# Patient Record
Sex: Female | Born: 1990
Health system: Southern US, Community
[De-identification: ages and names within clinical notes are randomized; demographics above are authoritative.]

## PROBLEM LIST (undated history)

## (undated) DIAGNOSIS — J45909 Unspecified asthma, uncomplicated: Secondary | ICD-10-CM

## (undated) HISTORY — PX: BREAST SURGERY: SHX581

---

## 2019-04-05 ENCOUNTER — Ambulatory Visit
Admission: EM | Admit: 2019-04-05 | Discharge: 2019-04-05 | Disposition: A | Discharge status: Home / Self Care | Payer: 59 | Attending: Family Medicine | Admitting: Family Medicine

## 2019-04-05 ENCOUNTER — Other Ambulatory Visit: Payer: Self-pay

## 2019-04-05 ENCOUNTER — Ambulatory Visit (INDEPENDENT_AMBULATORY_CARE_PROVIDER_SITE_OTHER): Payer: 59

## 2019-04-05 DIAGNOSIS — M79671 Pain in right foot: Secondary | ICD-10-CM

## 2019-04-05 HISTORY — DX: Unspecified asthma, uncomplicated: J45.909

## 2019-04-05 NOTE — ED Provider Notes (Signed)
[facility]    CSN: [id number] Arrival date & time: 04/05/19  1729      History   Chief Complaint Chief Complaint  Patient presents with  . Foot Injury    HPI Anne Cantrell is a 28 y.o. female.   HPI  Concern for fracture after she dropped a bottle of champagne on her right foot. She is able to bear weight and ambulate without assistance. Pain is most prominent on the outer portion of right foot. She is concern as her foot is very bruised and tender to touch. She has taken ibuprofen and applied ice regularly since injury occurred yesterday. Denies loss of sensation or numbness.  Past Medical History:  Diagnosis Date  . Asthma     There are no active problems to display for this patient.   Past Surgical History:  Procedure Laterality Date  . BREAST SURGERY      OB History   No obstetric history on file.      Home Medications    Prior to Admission medications   Not on File    Family History No family history on file.  Social History Social History   Tobacco Use  . Smoking status: Never Smoker  . Smokeless tobacco: Never Used  Substance Use Topics  . Alcohol use: Yes  . Drug use: Not on file     Allergies   Patient has no known allergies.   Review of Systems Review of Systems Pertinent negatives listed in HPI Physical Exam Triage Vital Signs ED Triage Vitals [04/05/19 1742]  Enc Vitals Group     BP 121/81     Pulse Rate 83     Resp 18     Temp 98.5 F (36.9 C)     Temp Source Oral     SpO2 99 %     Weight      Height      Head Circumference      Peak Flow      Pain Score 2     Pain Loc      Pain Edu?      Excl. in [place]?    No data found.  Updated Vital Signs BP 121/81 (BP Location: Left Arm)   Pulse 83   Temp 98.5 F (36.9 C) (Oral)   Resp 18   LMP 03/26/2019   SpO2 99%   Visual Acuity Right Eye Distance:   Left Eye Distance:   Bilateral Distance:    Right Eye Near:   Left Eye Near:    Bilateral Near:      Physical Exam General appearance: alert, well developed, well nourished, cooperative and in no distress Head: Normocephalic, without obvious abnormality, atraumatic Respiratory: Respirations even and unlabored, normal respiratory rate Heart: rate and rhythm normal. No gallop or murmurs noted on exam  Abdomen: BS +, no distention, no rebound tenderness, or no mass Extremities: No gross deformities Skin: Skin color, texture, turgor normal. No rashes seen  Psych: Appropriate mood and affect. Neurologic: Mental status: Alert, oriented to person, place, and time, thought content appropriate.  UC Treatments / Results  Labs (all labs ordered are listed, but only abnormal results are displayed) Labs Reviewed - No data to display  EKG   Radiology No results found.  Procedures Procedures (including critical care time)  Medications Ordered in UC Medications - No data to display  Initial Impression / Assessment and Plan / UC Course  I have reviewed the triage vital signs and the  nursing notes.  Pertinent labs & imaging results that were available during my care of the patient were reviewed by me and considered in my medical decision making (see chart for details).   [facility] after dropping a champagne bottle on her foot yesterday.  Imaging is still pending due to delays in radiology.  Patient advised to continue to ambulate using the cam walker as needed for ambulation and continue RICE.  She has obtained some relief with ibuprofen recommended 400 to 600 mg 3 times a day as needed.  We will follow-up by phone upon receipt of x-ray results. Patient was in agreement and verbalized understanding of plan. Final Clinical Impressions(s) / UC Diagnoses   Final diagnoses:  Foot pain, right     Discharge Instructions     Apply a compressive ACE bandage. Rest and elevate the affected painful area.  Apply cold compresses intermittently as needed.  As  pain recedes, begin normal activities slowly as tolerated.    Continue Ibuprofen 400-600 mg up to 3 times daily as needed. I will call you to notify you of your imaging results.     ED Prescriptions    None     Controlled Substance Prescriptions [place] Controlled Substance Registry consulted? Not Applicable   Bing NeighborsHarris, Zahriyah Joo S, FNP 04/05/19 1844

## 2019-04-05 NOTE — ED Triage Notes (Signed)
Pt states dropped a double bottle of champaign on her rt foot. Bruising and swelling noted.

## 2019-04-05 NOTE — Discharge Instructions (Addendum)
Apply a compressive ACE bandage. Rest and elevate the affected painful area.  Apply cold compresses intermittently as needed.  As pain recedes, begin normal activities slowly as tolerated.    Continue Ibuprofen 400-600 mg up to 3 times daily as needed. I will call you to notify you of your imaging results.

## 2019-04-06 ENCOUNTER — Telehealth: Payer: Self-pay | Admitting: Emergency Medicine

## 2019-04-06 NOTE — Telephone Encounter (Signed)
Reviewed complete right foot x-ray from UC visit yesterday 8/19 with Lavell Anchors, FNP:  "No evidence of acute fracture or dislocation. Joint spaces well preserved. Well-preserved bone mineral density. No intrinsic osseous Abnormalities." Patient verbalized understanding, no questions for me today.  She will continue with treatment plan as discussed at [facility] visit.

## 2020-03-20 DIAGNOSIS — I6991 Attention and concentration deficit following unspecified cerebrovascular disease: Secondary | ICD-10-CM | POA: Diagnosis not present

## 2020-04-24 DIAGNOSIS — R41840 Attention and concentration deficit: Secondary | ICD-10-CM | POA: Diagnosis not present

## 2020-05-22 IMAGING — DX RIGHT FOOT COMPLETE - 3+ VIEW
3 series · 3 of 3 positions shown · non-contrast
Comparison: None.

CLINICAL DATA: 28-year-old who dropped a bottle on the RIGHT foot
yesterday, complaining of persistent pain. Initial encounter.

EXAM:
RIGHT FOOT COMPLETE - 3+ VIEW

[foot supine dp]
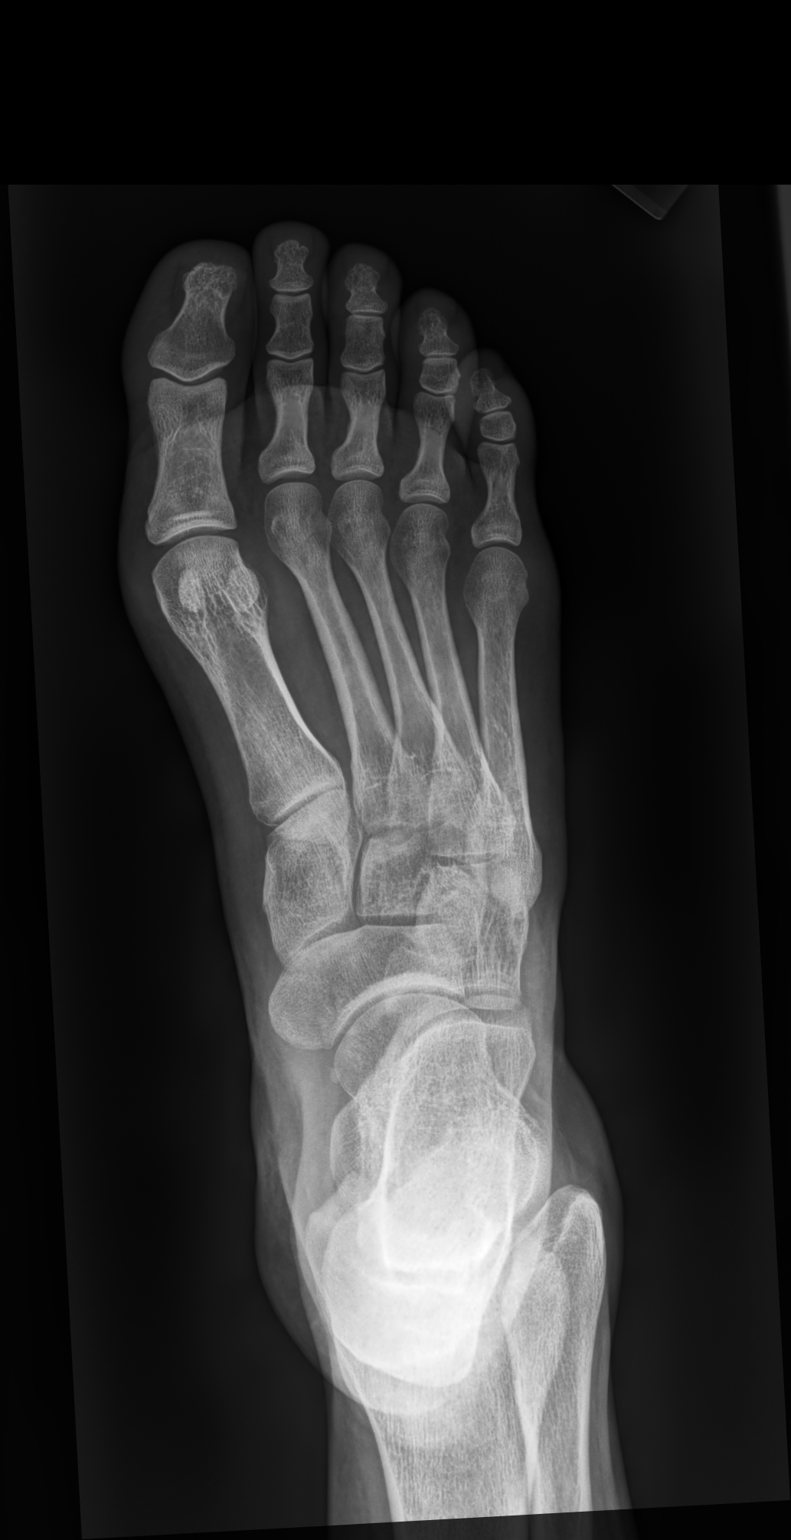

[foot medial oblique]
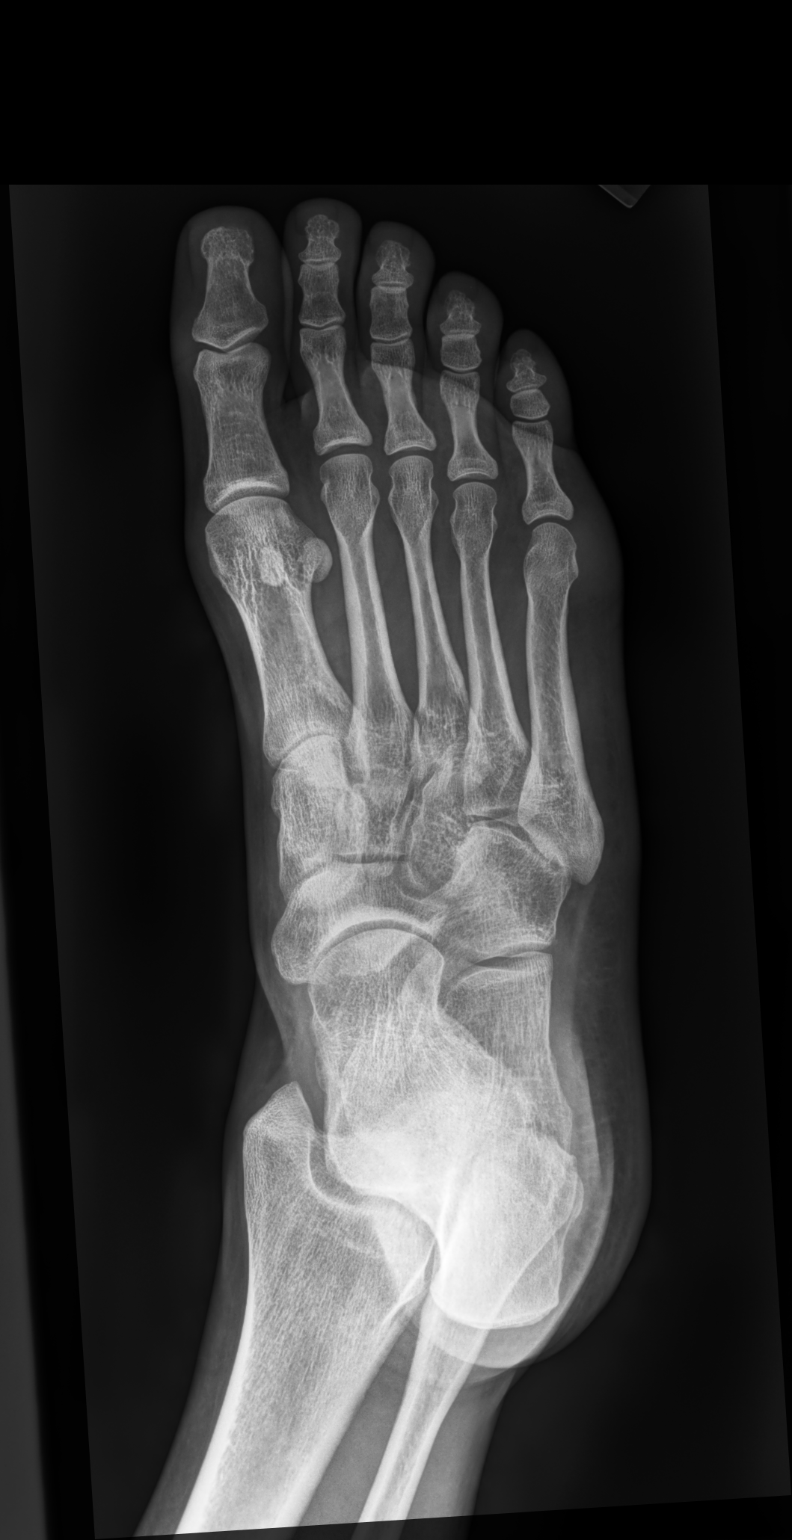

[foot supine lat]
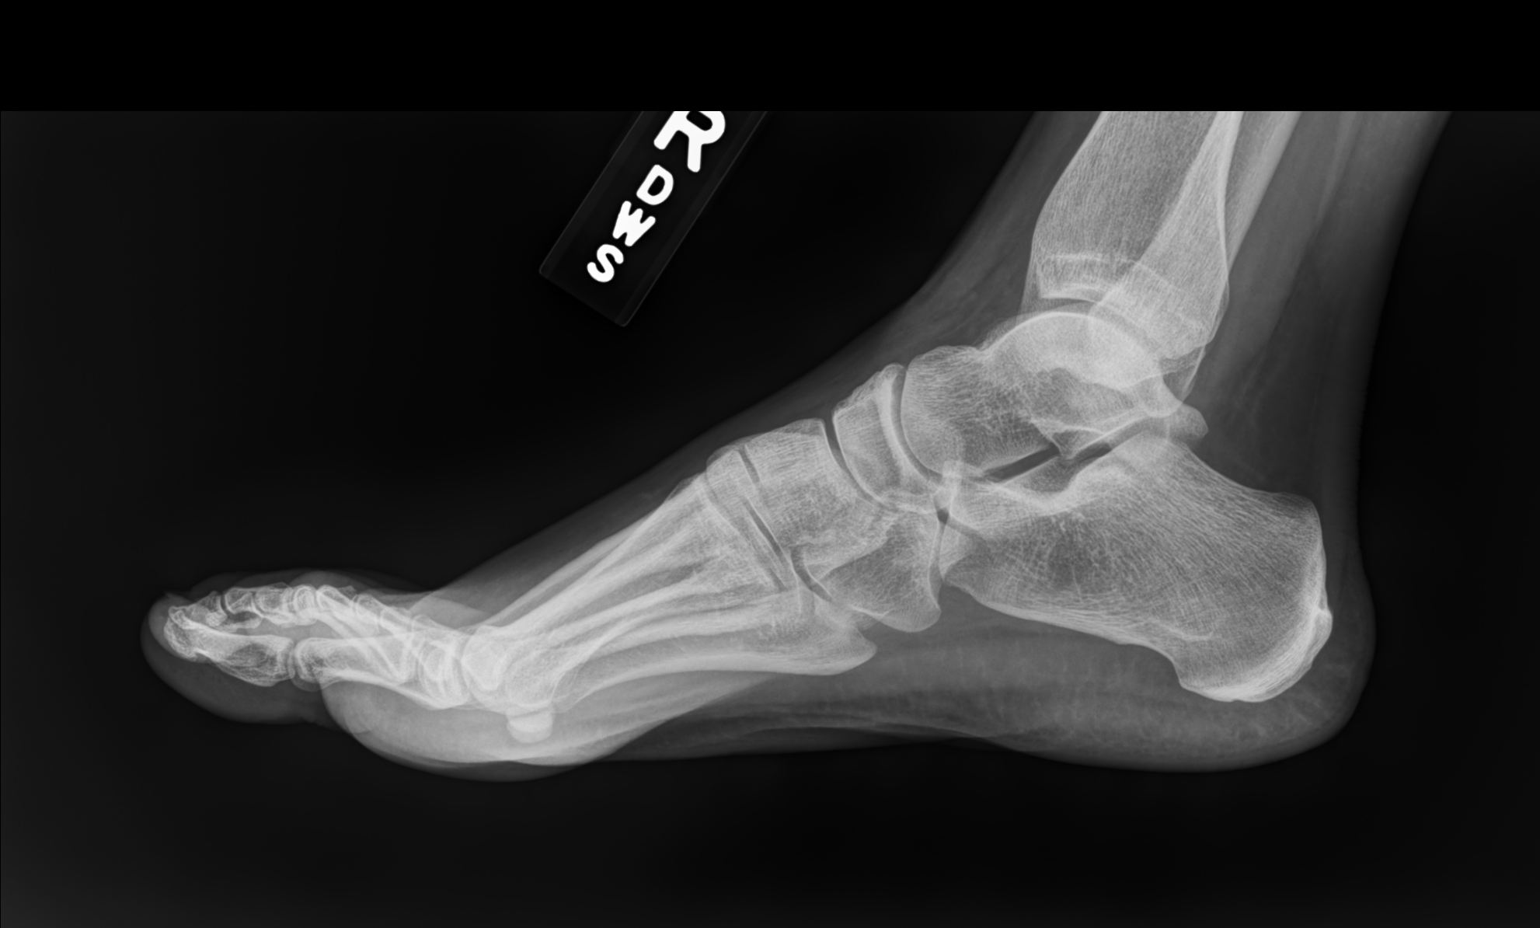

[3 of 3 positions shown; findings below may reference images not displayed]

FINDINGS: No evidence of acute fracture or dislocation. Joint spaces well
preserved. Well-preserved bone mineral density. No intrinsic osseous
abnormalities.
IMPRESSION: Normal examination.

## 2020-06-11 DIAGNOSIS — R41840 Attention and concentration deficit: Secondary | ICD-10-CM | POA: Diagnosis not present

## 2020-06-29 ENCOUNTER — Ambulatory Visit: Payer: 59

## 2024-05-17 ENCOUNTER — Ambulatory Visit (HOSPITAL_COMMUNITY)
Admission: EM | Admit: 2024-05-17 | Discharge: 2024-05-18 | Disposition: A | Discharge status: Home / Self Care | Attending: Behavioral Health | Admitting: Behavioral Health

## 2024-05-17 DIAGNOSIS — Z638 Other specified problems related to primary support group: Secondary | ICD-10-CM | POA: Insufficient documentation

## 2024-05-17 DIAGNOSIS — F32A Depression, unspecified: Secondary | ICD-10-CM | POA: Diagnosis not present

## 2024-05-17 DIAGNOSIS — R5383 Other fatigue: Secondary | ICD-10-CM | POA: Insufficient documentation

## 2024-05-17 DIAGNOSIS — R45851 Suicidal ideations: Secondary | ICD-10-CM

## 2024-05-17 DIAGNOSIS — G4700 Insomnia, unspecified: Secondary | ICD-10-CM | POA: Insufficient documentation

## 2024-05-17 LAB — POCT URINE DRUG SCREEN - MANUAL ENTRY (I-SCREEN)
POC Amphetamine UR: NOT DETECTED
POC Buprenorphine (BUP): NOT DETECTED
POC Cocaine UR: NOT DETECTED
POC Marijuana UR: NOT DETECTED
POC Methadone UR: NOT DETECTED
POC Methamphetamine UR: NOT DETECTED
POC Morphine: NOT DETECTED
POC Oxazepam (BZO): NOT DETECTED
POC Oxycodone UR: NOT DETECTED
POC Secobarbital (BAR): NOT DETECTED

## 2024-05-17 LAB — CBC WITH DIFFERENTIAL/PLATELET
Abs Immature Granulocytes: 0.03 K/uL (ref 0.00–0.07)
Basophils Absolute: 0.1 K/uL (ref 0.0–0.1)
Basophils Relative: 1 %
Eosinophils Absolute: 0.3 K/uL (ref 0.0–0.5)
Eosinophils Relative: 3 %
HCT: 37.6 % (ref 36.0–46.0)
Hemoglobin: 12.6 g/dL (ref 12.0–15.0)
Immature Granulocytes: 0 %
Lymphocytes Relative: 24 %
Lymphs Abs: 2.4 K/uL (ref 0.7–4.0)
MCH: 30.2 pg (ref 26.0–34.0)
MCHC: 33.5 g/dL (ref 30.0–36.0)
MCV: 90.2 fL (ref 80.0–100.0)
Monocytes Absolute: 0.7 K/uL (ref 0.1–1.0)
Monocytes Relative: 7 %
Neutro Abs: 6.6 K/uL (ref 1.7–7.7)
Neutrophils Relative %: 65 %
Platelets: 307 K/uL (ref 150–400)
RBC: 4.17 MIL/uL (ref 3.87–5.11)
RDW: 13.2 % (ref 11.5–15.5)
WBC: 10.1 K/uL (ref 4.0–10.5)
nRBC: 0 % (ref 0.0–0.2)

## 2024-05-17 LAB — POC URINE PREG, ED: Preg Test, Ur: NEGATIVE

## 2024-05-17 MED ORDER — MAGNESIUM HYDROXIDE 400 MG/5ML PO SUSP: 30.0000 mL | Freq: Every day | ORAL | Status: DC | PRN

## 2024-05-17 MED ORDER — HALOPERIDOL 5 MG PO TABS: 5.0000 mg | ORAL_TABLET | Freq: Three times a day (TID) | ORAL | Status: DC | PRN

## 2024-05-17 MED ORDER — ALUM & MAG HYDROXIDE-SIMETH 200-200-20 MG/5ML PO SUSP: 30.0000 mL | ORAL | Status: DC | PRN

## 2024-05-17 MED ORDER — DIPHENHYDRAMINE HCL 50 MG PO CAPS: 50.0000 mg | ORAL_CAPSULE | Freq: Three times a day (TID) | ORAL | Status: DC | PRN

## 2024-05-17 MED ORDER — ACETAMINOPHEN 325 MG PO TABS: 650.0000 mg | ORAL_TABLET | Freq: Four times a day (QID) | ORAL | Status: DC | PRN

## 2024-05-17 MED ORDER — OLANZAPINE 10 MG IM SOLR: 5.0000 mg | Freq: Three times a day (TID) | INTRAMUSCULAR | Status: DC | PRN

## 2024-05-17 MED ORDER — OLANZAPINE 10 MG IM SOLR: 10.0000 mg | Freq: Three times a day (TID) | INTRAMUSCULAR | Status: DC | PRN

## 2024-05-17 NOTE — ED Provider Notes (Signed)
 [facility] Continuous Assessment Admission H&P  Date: 05/17/24 Patient Name: Anne Cantrell MRN: [id number] Chief Complaint: increase anxiety with passive suicidal thought  Diagnoses:  Final diagnoses:  Depression with suicidal ideation  Family discord    HPI: Anne Cantrell, 33 y/o female with a history of MDD, was taking Strattera for a while but stopped taking it.  Presented to [facility], voluntarily.  Per the patient she has been under a lot of stress for a while taking care of her mother.  According to her yesterday she had a panic attack while she was in the shed and she saw some moved hers and thought how she could just hang herself with them.  Patient stated that she has been having suicidal ideation for good while now.  Patient also stated that she has been having a lot of family discord.  And today while she was out shopping her mom called her and told her she needed to use the restroom now so she left and went home when she got there her sister and mom was there and that is when she found out that they were getting up on her.  Patient currently lives with her mom because she is the caregiver of her mom.  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  According to patient she was seeing a therapist but because her insurance ran out and she did not have the money she stopped going.  She also stated that she was seeing a psychiatrist for 6 to 8 months but she stopped going there to.    Face-to-face observation of patient, patient is alert and oriented x 4, speech is clear, maintain eye contact.  Patient endorsed suicidal ideations with plans that she thought about hanging herself but stating that she is not currently suicidal.  Patient denies HI, AVH or paranoia.  Reports she drinks an alcoholic beverage once a day every other day.  She also reports that she had smoked marijuana in the past the last time was 2 to 3 weeks ago.  Patient denies access to  guns.  At this present moment patient does not seem to be influenced by internal stimuli does not seem to be in any acute distress.  However given patient presentation and history.  Writer discussed with patient the need for admissions patient is in agreement.  In observation.  Total Time spent with patient: 45 minutes  Musculoskeletal  Strength & Muscle Tone: within normal limits Gait & Station: normal Patient leans: N/A  Psychiatric Specialty Exam  Presentation General Appearance:  Casual  Eye Contact: Good  Speech: Clear and Coherent  Speech Volume: Normal  Handedness: Right   Mood and Affect  Mood: Anxious; Depressed  Affect: Congruent   Thought Process  Thought Processes: Coherent  Descriptions of Associations:Intact  Orientation:Full (Time, Place and Person)  Thought Content:WDL    Hallucinations:Hallucinations: None  Ideas of Reference:None  Suicidal Thoughts:Suicidal Thoughts: Yes, Passive SI Passive Intent and/or Plan: With Intent; With Plan  Homicidal Thoughts:Homicidal Thoughts: No   Sensorium  Memory: Immediate Fair  Judgment: Fair  Insight: Fair   Chartered certified accountant: Fair  Attention Span: Fair  Recall: [employer] of Knowledge: Fair  Language: Fair   Psychomotor Activity  Psychomotor Activity: Psychomotor Activity: Normal   Assets  Assets: Desire for Improvement; Social Support   Sleep  Sleep: Sleep: Fair Number of Hours of Sleep: 7   Nutritional Assessment (For OBS and FBC  admissions only) Has the patient had a weight loss or gain of 10 pounds or more in the last 3 months?: No Has the patient had a decrease in food intake/or appetite?: No Does the patient have dental problems?: No Does the patient have eating habits or behaviors that may be indicators of an eating disorder including binging or inducing vomiting?: No Has the patient recently lost weight without trying?: 0 Has the  patient been eating poorly because of a decreased appetite?: 0 Malnutrition Screening Tool Score: 0    Physical Exam HENT:     Head: Normocephalic.     Nose: Nose normal.  Eyes:     Pupils: Pupils are equal, round, and reactive to light.  Cardiovascular:     Rate and Rhythm: Normal rate.  Pulmonary:     Effort: Pulmonary effort is normal.  Musculoskeletal:        General: Normal range of motion.     Cervical back: Normal range of motion.  Neurological:     General: No focal deficit present.     Mental Status: She is alert.  Psychiatric:        Mood and Affect: Mood normal.        Behavior: Behavior normal.        Thought Content: Thought content normal.        Judgment: Judgment normal.    Review of Systems  Constitutional: Negative.   HENT: Negative.    Eyes: Negative.   Respiratory: Negative.    Cardiovascular: Negative.   Gastrointestinal: Negative.   Genitourinary: Negative.   Musculoskeletal: Negative.   Skin: Negative.   Neurological: Negative.   Psychiatric/Behavioral:  Positive for depression and suicidal ideas. The patient is nervous/anxious.     Blood pressure 127/84, pulse 91, temperature 98.8 F (37.1 C), temperature source Oral, resp. rate 20, SpO2 100%. There is no height or weight on file to calculate BMI.  Past Psychiatric History: MDD  Is the patient at risk to self? Yes  Has the patient been a risk to self in the past 6 months? Yes .    Has the patient been a risk to self within the distant past? Yes   Is the patient a risk to others? No   Has the patient been a risk to others in the past 6 months? No   Has the patient been a risk to others within the distant past? No   Past Medical History: See chart  Family History: Unknown  Social History: Marijuana, alcohol  Last Labs:  No visits with results within 6 Month(s) from this visit.  Latest known visit with results is:  No results found for any previous visit.    Allergies: Patient has  no known allergies.  Medications:     Medical Decision Making  Observation unit    Recommendations  Based on my evaluation the patient does not appear to have an emergency medical condition.  Gaither Pouch, NP 05/17/24  10:32 PM

## 2024-05-18 ENCOUNTER — Other Ambulatory Visit: Payer: Self-pay

## 2024-05-18 DIAGNOSIS — F32A Depression, unspecified: Secondary | ICD-10-CM | POA: Diagnosis not present

## 2024-05-18 DIAGNOSIS — G4700 Insomnia, unspecified: Secondary | ICD-10-CM | POA: Diagnosis not present

## 2024-05-18 DIAGNOSIS — R45851 Suicidal ideations: Secondary | ICD-10-CM | POA: Diagnosis not present

## 2024-05-18 DIAGNOSIS — Z638 Other specified problems related to primary support group: Secondary | ICD-10-CM | POA: Diagnosis not present

## 2024-05-18 DIAGNOSIS — R5383 Other fatigue: Secondary | ICD-10-CM | POA: Diagnosis not present

## 2024-05-18 LAB — ETHANOL: Alcohol, Ethyl (B): <15 mg/dL (ref <15)

## 2024-05-18 LAB — COMPREHENSIVE METABOLIC PANEL WITH GFR
ALT: 16 U/L (ref 0–44)
AST: 17 U/L (ref 15–41)
Albumin: 4.1 g/dL (ref 3.5–5.0)
Alkaline Phosphatase: 44 U/L (ref 38–126)
Anion gap: 10 (ref 5–15)
BUN: 11 mg/dL (ref 6–20)
CO2: 24 mmol/L (ref 22–32)
Calcium: 9.7 mg/dL (ref 8.9–10.3)
Chloride: 105 mmol/L (ref 98–111)
Creatinine, Ser: 0.67 mg/dL (ref 0.44–1.00)
GFR, Estimated: >60 mL/min (ref >60)
Glucose, Bld: 86 mg/dL (ref 70–99)
Potassium: 3.6 mmol/L (ref 3.5–5.1)
Sodium: 139 mmol/L (ref 135–145)
Total Bilirubin: 0.6 mg/dL (ref 0.0–1.2)
Total Protein: 6.9 g/dL (ref 6.5–8.1)

## 2024-05-18 LAB — TSH: TSH: 2.953 u[IU]/mL (ref 0.350–4.500)

## 2024-05-18 NOTE — Progress Notes (Signed)
 Pt is awake, alert and oriented X4. Pt did not  voice any complaints for pain or discomfort. No signs of acute distress noted. Pt denies current SI/HI/AVH, plan or intent. Staff will monitor for pt's safety.

## 2024-05-18 NOTE — ED Notes (Signed)
 Pt A&O x 4, no distress noted. Presents with SI, plan to hang self.  Pt contracts for safety.

## 2024-05-18 NOTE — BH Assessment (Addendum)
 Comprehensive Clinical Assessment (CCA) Note  05/18/2024 Anne Cantrell [id number]  Disposition: Gaither Pouch, NP, recommends continuous observation for safety and stabilization with psych reassessment in the AM.   The patient demonstrates the following risk factors for suicide: Chronic risk factors for suicide include: psychiatric disorder of Major Depressive Disorder. Acute risk factors for suicide include: caregiver and high work-related stressors. Protective factors for this patient include: responsibility to others (children, family), coping skills, and hope for the future. Considering these factors, the overall suicide risk at this point appears to be high. Patient is not appropriate for outpatient follow up.  Anne Cantrell is a 33 year old female presenting as a voluntary walk-in to [facility] due to [facility] with plan to hang herself. Patient reports history of depression. Patient denied HI, psychosis and alcohol/drug usage. Patient was brought in by her self due to concerns.   Patient admits to [facility] with plan to hang herself on yesterday. Patient states I was in the shed and had a panic attack, when I looked up at the rafters, I thought, I could find a rope and hang myself, patient reports calling a friend, whom she reports helped her realized that she did not want to die in the moment and that it was just a thought of an easy way out. Patient reports having suicidal thoughts every now and then, unable to give timeframe. 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 Patient reports worsening depressive symptoms. Patient reports sleeping 6-8 hours nightly and has low appetite.   Patient does not have a therapist or a psychiatrist. Patient patient is not prescribed any psych medications.  Patient denies prior inpatient psychiatric treatment, suicide attempts and self-harming  behaviors.  Patient resides with mother. Patient is the main caregiver of her mother whom is in a wheelchair and has MS. Patient is currently employed as a Chief Operating Officer in a musical. Patient reports work-related stressors stressors on her job, stating its very stressful. Patient denies access to guns. Patient was anxious and cooperative during assessment.   Chief Complaint:  Chief Complaint  Patient presents with   Stress   Suicidal Ideation   Visit Diagnosis:  Major Depressive Disorder    CCA Screening, Triage and Referral (STR)  Patient Reported Information How did you hear about us ? Family/Friend  What Is the Reason for Your Visit/Call Today? Pt presents to [facility] as a voluntary walk-in, accompanied by her sister with complaint due to stress and SI, with a plan to hang herself. Pt denies intent. Pt reports experiencing stress related to caring for her mother and work environment. Pt reports feeling hostile, on edge and panic attacks. Pt states  I don't want to die, I just think my brain wanted me to have a way out at that moment. Pt denies prior suicide attempts, self-injurious behaviors and psychiatric impatient hospitalizations. Pt denies being established with therapist and denies taking prescribed medications. Pt is able to contract for safety at this time. Pt currently denies HI,AVH and substance/alcohol use.  How Long Has This Been Causing You Problems? 1 wk - 1 month  What Do You Feel Would Help You the Most Today? Treatment for Depression or other mood problem; Stress Management   Have You Recently Had Any Thoughts About Hurting Yourself? Yes  Are You Planning to Commit Suicide/Harm Yourself At This time? No   Flowsheet Row ED from 05/17/2024 in [facility]  C-SSRS RISK CATEGORY Moderate Risk    Have you Recently Had Thoughts About Hurting Someone [place]? No  Are You Planning to Harm Someone at This Time? No  Explanation: n/a   Have You  Used Any Alcohol or Drugs in the Past 24 Hours? No  How Long Ago Did You Use Drugs or Alcohol? N/a What Did You Use and How Much? N/a  Do You Currently Have a Therapist/Psychiatrist? No  Name of Therapist/Psychiatrist:  n/a  Have You Been Recently Discharged From Any Office Practice or Programs? No  Explanation of Discharge From Practice/Program: n/a    CCA Screening Triage Referral Assessment Type of Contact: Face-to-Face  Telemedicine Service Delivery:  n/a Is this Initial or Reassessment?  N/a Date Telepsych consult ordered in [facility]:   N/a Time Telepsych consult ordered in [facility]:  N/a Location of Assessment: [facility] Assessment Services  Provider Location: [facility] Assessment Services   Collateral Involvement: none   Does Patient Have a Automotive engineer Guardian? No  Legal Guardian Contact Information: n/a  Copy of Legal Guardianship Form: -- (n/a)  Legal Guardian Notified of Arrival: -- (n/a)  Legal Guardian Notified of Pending Discharge: -- (n/a)  If Minor and Not Living with Parent(s), Who has Custody? n/a  Is CPS involved or ever been involved? Never  Is APS involved or ever been involved? Never   Patient Determined To Be At Risk for Harm To Self or Others Based on Review of Patient Reported Information or Presenting Complaint? Yes, for Self-Harm  Method: Plan with intent and identified person  Availability of Means: In hand or used  Intent: Clearly intends on inflicting harm that could cause death  Notification Required: No need or identified person  Additional Information for Danger to Others Potential: -- (n/a)  Additional Comments for Danger to Others Potential: n/a  Are There Guns or Other Weapons in Your Home? No  Types of Guns/Weapons: n/a  Are These Weapons Safely Secured?                            -- (n/a)  Who Could Verify You Are Able To Have These Secured: n/a  Do You Have any Outstanding Charges, Pending Court Dates,  Parole/Probation? none reported  Contacted To Inform of Risk of Harm To Self or Others: Family/Significant Other:   Does Patient Present under Involuntary Commitment? No   [place] of Residence: [place]   Patient Currently Receiving the Following Services: Not Receiving Services   Determination of Need: Urgent (48 hours)   Options For Referral: Other: Comment; Outpatient Therapy; Medication Management; BH Urgent Care; Inpatient Hospitalization   CCA Biopsychosocial Patient Reported Schizophrenia/Schizoaffective Diagnosis in Past: No   Strengths: self-awareness   Mental Health Symptoms Depression:  Hopelessness; Fatigue; Increase/decrease in appetite   Duration of Depressive symptoms: Duration of Depressive Symptoms: Greater than two weeks   Mania:  None   Anxiety:   Worrying   Psychosis:  None   Duration of Psychotic symptoms:    Trauma:  None   Obsessions:  None   Compulsions:  None   Inattention:  None   Hyperactivity/Impulsivity:  None   Oppositional/Defiant Behaviors:  None   Emotional Irregularity:  None   Other Mood/Personality Symptoms:  n/a    Mental Status Exam Appearance and self-care  Stature:  Average   Weight:  Average weight   Clothing:  Age-appropriate   Grooming:  Normal   Cosmetic  use:  None   Posture/gait:  Normal   Motor activity:  Not Remarkable   Sensorium  Attention:  Normal   Concentration:  Normal   Orientation:  X5   Recall/memory:  Normal   Affect and Mood  Affect:  Appropriate   Mood:  Depressed   Relating  Eye contact:  Normal   Facial expression:  Depressed   Attitude toward examiner:  Cooperative   Thought and Language  Speech flow: Normal   Thought content:  Appropriate to Mood and Circumstances   Preoccupation:  None   Hallucinations:  None   Organization:  Coherent   [employer] of Knowledge:  Average   Intelligence:  Average   Abstraction:  Normal   Judgement:   Normal   Reality Testing:  Adequate   Insight:  Fair   Decision Making:  Impulsive   Social Functioning  Social Maturity:  Impulsive   Social Judgement:  Naive   Stress  Stressors:  Work; Other (Comment)   Coping Ability:  Overwhelmed; Exhausted   Skill Deficits:  Self-control   Supports:  Family; Friends/Service system     Religion: Religion/Spirituality Are You A Religious Person?: No How Might This Affect Treatment?: n/a  Leisure/Recreation: Leisure / Recreation Do You Have Hobbies?: Yes Leisure and Hobbies: playing piano and playing/walking dog  Exercise/Diet: Exercise/Diet Do You Exercise?: No Have You Gained or Lost A Significant Amount of Weight in the Past Six Months?: No Do You Follow a Special Diet?: No Do You Have Any Trouble Sleeping?: No   CCA Employment/Education Employment/Work Situation: Employment / Work Situation Employment Situation: Employed Work Stressors: Chief Operating Officer, high stress Patient's Job has Been Impacted by Current Illness: Yes Describe how Patient's Job has Been Impacted: high stress Has Patient ever Been in the [employer]?: No  Education: Education Is Patient Currently Attending School?: No Last Grade Completed: 18 Did You Product manager?: Yes What Type of College Degree Do you Have?: 3x Associates Degrees Did You Have An Individualized Education Program (IIEP): No Did You Have Any Difficulty At School?: No Patient's Education Has Been Impacted by Current Illness: No   CCA Family/Childhood History Family and Relationship History: Family history Marital status: Single Does patient have children?: No  Childhood History:  Childhood History By whom was/is the patient raised?: Both parents Did patient suffer any verbal/emotional/physical/sexual abuse as a child?: No Did patient suffer from severe childhood neglect?: No Has patient ever been sexually abused/assaulted/raped as an adolescent or adult?: No Was the  patient ever a victim of a crime or a disaster?: No Witnessed domestic violence?: No Has patient been affected by domestic violence as an adult?: No   CCA Substance Use Alcohol/Drug Use: Alcohol / Drug Use Pain Medications: see MAR Prescriptions: see MAR Over the Counter: see MAR History of alcohol / drug use?: No history of alcohol / drug abuse Longest period of sobriety (when/how long): n/a Negative Consequences of Use:  (n/a) Withdrawal Symptoms:  (n/a)     ASAM's:  Six Dimensions of Multidimensional Assessment  Dimension 1:  Acute Intoxication and/or Withdrawal Potential:   Dimension 1:  Description of individual's past and current experiences of substance use and withdrawal: n/a  Dimension 2:  Biomedical Conditions and Complications:   Dimension 2:  Description of patient's biomedical conditions and  complications: n/a  Dimension 3:  Emotional, Behavioral, or Cognitive Conditions and Complications:  Dimension 3:  Description of emotional, behavioral, or cognitive conditions and complications: n/a  Dimension 4:  Readiness to Change:  Dimension 4:  Description of Readiness to Change criteria: n/a  Dimension 5:  Relapse, Continued use, or Continued Problem Potential:  Dimension 5:  Relapse, continued use, or continued problem potential critiera description: n/a  Dimension 6:  Recovery/Living Environment:  Dimension 6:  Recovery/Iiving environment criteria description: n/a  ASAM Severity Score:    ASAM Recommended Level of Treatment: ASAM Recommended Level of Treatment:  (n/a)   Substance use Disorder (SUD) Substance Use Disorder (SUD)  Checklist Symptoms of Substance Use:  (n/a)  Recommendations for Services/Supports/Treatments: Recommendations for Services/Supports/Treatments Recommendations For Services/Supports/Treatments: Individual Therapy, Inpatient Hospitalization, Medication Management  Disposition Recommendation per psychiatric provider:  Recommends continuous  observation for safety and stabilization with psych reassessment in the AM.    DSM5 Diagnoses: There are no active problems to display for this patient.   Referrals to Alternative Service(s): Referred to Alternative Service(s):   Place:   Date:   Time:    Referred to Alternative Service(s):   Place:   Date:   Time:    Referred to Alternative Service(s):   Place:   Date:   Time:    Referred to Alternative Service(s):   Place:   Date:   Time:     Anne Cantrell, [facility]

## 2024-05-18 NOTE — ED Provider Notes (Signed)
 FBC/OBS ASAP Discharge Summary  Date and Time: 05/18/2024 11:27 AM  Name: Anne Cantrell  MRN:  [id number]   Discharge Diagnoses:  Final diagnoses:  Depression with suicidal ideation  Family discord    Subjective: Anne Cantrell is a 33 year old female who presented to [facility]  last night, voluntarily,  due to [facility] with plan to hang herself. Patient reported  history of depression. Patient denied HI, psychosis and alcohol/drug usage. She was accompanied by her sister who was concerned about her well-being after she verbalized SI.  She reported to the clinician that she became overwhelmed and thought about hanging herself, though she did not want to die. 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 Patient reported  worsening depressive symptoms including decreased sleep, decreased appetite, fatigue, and self-care deficiency. She reported a hx of ADHD, used to take Strattera  but had to stop seeing the provider due to financial problems. She currently has no  therapist or a psychiatrist.  Patient denied prior inpatient psychiatric treatment, suicide attempts and self-harming behaviors.She reported that she currently  resides with her mother who suffers from MS and is total care, wheelchair bound. Patient is currently employed as a Chief Operating Officer in a musical and  reported  work-related stressors stressors in addition to caregiver fatigue.   Stay Summary: Patient was admitted to the Observation unit and clinician recommended reassessment and collateral information in AM. She is seen today. Alert and oriented, cooperative upon approach. She appears healthy and well nourished. Her thought process is coherent and goal-directed. Speech is clear, well articulated. Patient maintains eye contact and remains calm throughout this assessment. . Patient denies suicidal ideations  but does admit that yesterday she was feeling overwhelmed and said things that she should not have said. She states I don't want to die, but again I need to express my feelings, my sisters don't seem to understand my pain. Patient is taking care of her severely ill mother, takes care of the pets and has to work to make an income. Her 3 other sisters live in their own homes and, when they come home, their kids make a lot of mess like clogging the toilet, diplacing things, etc. Patient and mother are in the process of moving to another home. Patient reports she has not taken her ADHD medication in a while and it is very difficult for her to handle the stress. Patient states her personal hygiene has been declining and everything is falling behind. She reports that she talked  to her friend for about an hour  yesterday, trying to express her feelings. She mentioned that she was trying to find  an exit plan  and what she meant was how I can get out of this frustration and live a better life, I am 33 now, no children, no life plan. When she tries to talk to her sisters about the situation, they say she is being difficult.  Patient admits to using Marijuana occasionally but has not used in a while. Reports drinking beer occasionally, and there is no dependency. She reports that she has a boyfriend that her sisters do not approve, because he has recovered from drug usage.  Patient denies SI/HI/AVH and reports she is more interested in getting back on medication and receiving therapy.  She gives permission to contact her sister  Trenice Mesa [phone]: sister reports  she will be supportive and will make sure pt remains safe at the house. Patient also gives permission to contact her boyfriend Dorise [phone]: Dorise reports that he is and will be supportive to her. Patient reports that her appetite and sleep have improved. She denies medical/health concerns.    Total Time spent with patient: 1 hour  Past  Psychiatric History: ADHD Past Medical History: NA Family History: Mother suffers from MS Family Psychiatric History: NA Social History: Lives with her mother and her mother's primary caregiver.  Tobacco Cessation:  N/A, patient does not currently use tobacco products  Current Medications:  Current Facility-Administered Medications  Medication Dose Route Frequency Provider Last Rate Last Admin   acetaminophen (TYLENOL) tablet 650 mg  650 mg Oral Q6H PRN Trudy Carwin, NP       alum & mag hydroxide-simeth (MAALOX/MYLANTA) 200-200-20 MG/5ML suspension 30 mL  30 mL Oral Q4H PRN Trudy Carwin, NP       haloperidol (HALDOL) tablet 5 mg  5 mg Oral TID PRN Trudy Carwin, NP       And   diphenhydrAMINE (BENADRYL) capsule 50 mg  50 mg Oral TID PRN Trudy Carwin, NP       magnesium hydroxide (MILK OF MAGNESIA) suspension 30 mL  30 mL Oral Daily PRN Trudy Carwin, NP       OLANZapine (ZYPREXA) injection 10 mg  10 mg Intramuscular TID PRN Trudy Carwin, NP       OLANZapine (ZYPREXA) injection 5 mg  5 mg Intramuscular TID PRN Trudy Carwin, NP       Current Outpatient Medications  Medication Sig Dispense Refill   naproxen sodium (ALEVE) 220 MG tablet Take 220 mg by mouth every 8 (eight) hours as needed (For menstrual cramps). Tk 2 t PO at onset then 1t PO q8h      PTA Medications:  Facility Ordered Medications  Medication   acetaminophen (TYLENOL) tablet 650 mg   alum & mag hydroxide-simeth (MAALOX/MYLANTA) 200-200-20 MG/5ML suspension 30 mL   magnesium hydroxide (MILK OF MAGNESIA) suspension 30 mL   haloperidol (HALDOL) tablet 5 mg   And   diphenhydrAMINE (BENADRYL) capsule 50 mg   OLANZapine (ZYPREXA) injection 5 mg   OLANZapine (ZYPREXA) injection 10 mg   PTA Medications  Medication Sig   naproxen sodium (ALEVE) 220 MG tablet Take 220 mg by mouth every 8 (eight) hours as needed (For menstrual cramps). Tk 2 t PO at onset then 1t PO q8h       05/18/2024   11:25 AM  Depression screen  PHQ 2/9  Decreased Interest 1  Down, Depressed, Hopeless 1  PHQ - 2 Score 2  Altered sleeping 1  Tired, decreased energy 1  Change in appetite 1  Feeling bad or failure about yourself  0  Trouble concentrating 1  Moving slowly or fidgety/restless 0  Suicidal thoughts 0  PHQ-9 Score 6    Flowsheet Row ED from 05/17/2024 in [facility]  C-SSRS RISK CATEGORY Moderate Risk    Musculoskeletal  Strength & Muscle Tone: within normal limits Gait & Station: normal Patient leans: N/A  Psychiatric Specialty Exam  Presentation  General Appearance:  Casual  Eye Contact: Good  Speech: Clear and Coherent  Speech Volume: Normal  Handedness: Right   Mood and Affect  Mood: Anxious; Depressed  Affect: Congruent   Thought Process  Thought Processes: Coherent  Descriptions of Associations:Intact  Orientation:Full (Time, Place and Person)  Thought Content:WDL  Diagnosis of Schizophrenia or  Schizoaffective disorder in past: No    Hallucinations:Hallucinations: None  Ideas of Reference:None  Suicidal Thoughts:Suicidal Thoughts: Yes, Passive SI Passive Intent and/or Plan: With Intent; With Plan  Homicidal Thoughts:Homicidal Thoughts: No   Sensorium  Memory: Immediate Fair  Judgment: Fair  Insight: Fair   Chartered certified accountant: Fair  Attention Span: Fair  Recall: Fair  Fund of Knowledge: Fair  Language: Fair   Psychomotor Activity  Psychomotor Activity: Psychomotor Activity: Normal   Assets  Assets: Desire for Improvement; Social Support   Sleep  Sleep: Sleep: Fair  No Safety Checks orders active in given range  Nutritional Assessment (For OBS and FBC admissions only) Has the patient had a weight loss or gain of 10 pounds or more in the last 3 months?: No Has the patient had a decrease in food intake/or appetite?: No Does the patient have dental problems?: No Does the patient have  eating habits or behaviors that may be indicators of an eating disorder including binging or inducing vomiting?: No Has the patient recently lost weight without trying?: 0 Has the patient been eating poorly because of a decreased appetite?: 0 Malnutrition Screening Tool Score: 0    Physical Exam  Physical Exam Vitals and nursing note reviewed.  Constitutional:      Appearance: Normal appearance.  HENT:     Head: Normocephalic and atraumatic.     Right Ear: Tympanic membrane normal.     Left Ear: Tympanic membrane normal.     Nose: Nose normal.     Mouth/Throat:     Mouth: Mucous membranes are moist.  Eyes:     Extraocular Movements: Extraocular movements intact.     Pupils: Pupils are equal, round, and reactive to light.  Cardiovascular:     Rate and Rhythm: Normal rate.     Pulses: Normal pulses.  Pulmonary:     Effort: Pulmonary effort is normal.  Musculoskeletal:     Cervical back: Normal range of motion and neck supple.  Neurological:     General: No focal deficit present.     Mental Status: She is alert and oriented to person, place, and time.    Review of Systems  Constitutional: Negative.   HENT: Negative.    Eyes: Negative.   Respiratory: Negative.    Cardiovascular: Negative.   Gastrointestinal: Negative.   Genitourinary: Negative.   Skin: Negative.   Neurological: Negative.   Endo/Heme/Allergies: Negative.   Psychiatric/Behavioral:  Positive for depression. The patient is nervous/anxious.    Blood pressure 120/78, pulse 72, temperature 98.4 F (36.9 C), temperature source Oral, resp. rate 18, SpO2 100%. There is no height or weight on file to calculate BMI.  Demographic Factors:  Caucasian  Loss Factors: NA  Historical Factors: NA  Risk Reduction Factors:   Sense of responsibility to family, Living with another person, especially a relative, and Positive coping skills or problem solving skills  Continued Clinical Symptoms:  Depression:    Insomnia Previous Psychiatric Diagnoses and Treatments  Cognitive Features That Contribute To Risk:  None    Suicide Risk:  Minimal: No identifiable suicidal ideation.  Patients presenting with no risk factors but with morbid ruminations; may be classified as minimal risk based on the severity of the depressive symptoms  Plan Of Care/Follow-up recommendations:  Activity:  As tolerated Diet:  Regular  Disposition: Discharge:  Patient denies thoughts of self-harm and expresses motivation for treatment in outpatient setting. She states she is willing to return to [facility] services in AM  as she wants to resume her medication regimen as well as therapy. Encouragements and support provided. Safety plan reviewed with patient, her sister and boyfriend.  Randall Bouquet, NP 05/18/2024, 11:27 AM

## 2024-05-18 NOTE — Discharge Summary (Signed)
 Anne Cantrell to be discharged Home per NP order. Discussed with the patient and all questions fully answered. An After Visit Summary was printed and given to the patient. All belonging returned. Patient escorted out, and discharged home via private auto.  [name]  05/18/2024 1:51 PM

## 2024-05-18 NOTE — Discharge Instructions (Addendum)

## 2024-05-18 NOTE — ED Notes (Signed)
 Pt observed/assessed in recliner sleeping. RR even and unlabored, appearing in no noted distress. Environmental check complete, will continue to monitor for safety

## 2024-05-18 NOTE — Progress Notes (Signed)
 Pt is asleep. Respirations are even and unlabored. No signs of acute distress noted. Staff will monitor for pt's safety.

## 2024-05-18 NOTE — Progress Notes (Signed)
   05/17/24 1958  [facility] Triage Screening (Walk-ins at [facility] only)  How Did You Hear About Us ? Family/Friend  What Is the Reason for Your Visit/Call Today? Pt presents to [facility] as a voluntary walk-in, accompanied by her sister with complaint due to stress and SI, with a plan to hang herself. Pt denies intent. Pt reports experiencing stress related to caring for her mother and work environment. Pt reports feeling hostile, on edge and panic attacks. Pt states  I don't want to die, I just think my brain wanted me to have a way out at that moment. Pt denies prior suicide attempts, self-injurious behaviors and psychiatric impatient hospitalizations. Pt denies being established with therapist and denies taking prescribed medications. Pt is able to contract for safety at this time. Pt currently denies HI,AVH and substance/alcohol use.  How Long Has This Been Causing You Problems? 1 wk - 1 month  Have You Recently Had Any Thoughts About Hurting Yourself? Yes  How long ago did you have thoughts about hurting yourself? currently  Are You Planning to Commit Suicide/Harm Yourself At This time? No  Have you Recently Had Thoughts About Hurting Someone Sherral? No  Are You Planning To Harm Someone At This Time? No  Physical Abuse Denies  Verbal Abuse Denies  Sexual Abuse Denies  Exploitation of patient/patient's resources Denies  Self-Neglect Denies  Are you currently experiencing any auditory, visual or other hallucinations? No  Have You Used Any Alcohol or Drugs in the Past 24 Hours? No  Do you have any current medical co-morbidities that require immediate attention? No  Clinician description of patient physical appearance/behavior: Cooperative, tearful at times, oriented  What Do You Feel Would Help You the Most Today? Treatment for Depression or other mood problem;Stress Management  If access to [facility] was not available, would you have sought care in the Emergency Department? No  Determination of Need  Urgent (48 hours)  Options For Referral Other: Comment;Outpatient Therapy;Medication Management;BH Urgent Care  Determination of Need filed? Yes

## 2024-05-19 ENCOUNTER — Ambulatory Visit (INDEPENDENT_AMBULATORY_CARE_PROVIDER_SITE_OTHER): Admitting: Licensed Clinical Social Worker

## 2024-05-19 ENCOUNTER — Encounter (HOSPITAL_COMMUNITY): Payer: Self-pay | Admitting: Licensed Clinical Social Worker

## 2024-05-19 DIAGNOSIS — F411 Generalized anxiety disorder: Secondary | ICD-10-CM | POA: Diagnosis not present

## 2024-05-19 DIAGNOSIS — F329 Major depressive disorder, single episode, unspecified: Secondary | ICD-10-CM

## 2024-05-19 DIAGNOSIS — F321 Major depressive disorder, single episode, moderate: Secondary | ICD-10-CM | POA: Insufficient documentation

## 2024-05-19 NOTE — Progress Notes (Signed)
 Comprehensive Clinical Assessment (CCA) Note 05/19/2024 Anne Cantrell [id number]  Visit Diagnosis: MDD and GAD   Virtual Visit via Video Note  I connected with Anne Cantrell on 05/19/24 at  1:00 PM EDT by a video enabled telemedicine application and verified that I am speaking with the correct person using two identifiers.  Location: Patient: Anne Cantrell  Provider: Providers Home Office    I discussed the limitations of evaluation and management by telemedicine and the availability of in person appointments. The patient expressed understanding and agreed to proceed.  Client is a  33 year old  female. Client is referred by  [facility] for a depression, anxiety and SI .   Client states mental health symptoms as evidenced by:    Depression Hopelessness; Fatigue; Increase/decrease in appetite Hopelessness; Fatigue; Increase/decrease in appetite; Difficulty Concentrating; Irritability; Tearfulness; Sleep (too much or little); Worthlessness; Change in energy/activity Hopelessness; Fatigue; Increase/decrease in appetite; Difficulty Concentrating; Irritability; Tearfulness; Sleep (too much or little); Worthlessness; Change in energy/activity  Duration of Depressive Symptoms Greater than two weeks Greater than two weeks Greater than two weeks  Mania None None None  Anxiety Worrying Worrying; Tension; Irritability; Restlessness Worrying; Tension; Irritability; Restlessness  Psychosis None None None  Trauma None None None  Obsessions None None None  Compulsions None None None  Inattention None None None  Hyperactivity/Impulsivity None None None  Oppositional/Defiant Behaviors None None None  Emotional Irregularity None Chronic feelings of emptiness; Potentially harmful impulsivity Chronic feelings of emptiness; Potentially harmful impulsivity  Other Mood/Personality Symptoms n/a n/a n/a      Client denies suicidal and homicidal ideations at this time  Client denies hallucinations and  delusions at this time   Client was screened for the following SDOH: exercise, stress/tension   Assessment Information that integrates subjective and objective details with a therapist's professional interpretation:   Anne Cantrell comes in today alert and oriented x 5.  She was pleasant, cooperative, maintained good eye contact.  She engaged well in therapy session and was dressed casually.  Patient presented with anxious and depressed mood\affect.  Patient comes in as a referral from behavioral health urgent care at [facility] for suicidal ideations.  Anne Cantrell was put on observation status for 1 night after that she was discharged with instructions to follow-up with outpatient services at [facility].    Anne Cantrell has significant psychiatric history for ADHD and depression but has not taken medications in over 2 years.  Anne Cantrell states that she was having success with Strattera but it was a financial burden and she was starting to have headaches that she believes were caused by the medications.  Anne Cantrell reports that she stopped wanting to see her psychiatrist after he was dismissive stating your headaches are only caused by your frontal lobe working harder.  Stressors for patient to include financials, work, and family conflict.  Patient is the primary caregiver for her mother who has multiple sclerosis.  [name] took over caregiving after her father died over 2 years ago.  Anne Cantrell states that she has limited support from her family when it comes to caregiving stating when I ask for help it seems like I am just being a burden to them.  Anne Cantrell states that she does not feel like her family is being honest with her as evidenced by deceiving her by coming to the [place] [facility] for observation due to her mood swings, irritability, and suicidal ideations.  Patient would like to reengage in outpatient  services for medication  management only.  LCSW offered therapeutic services but patient states that she would like to stabilize first on medications.  In today's session she denies any suicidal, homicidal ideations.  Patient denies any auditory or visual hallucinations.  LCSW verbalized the patient walk-in hours at [facility] and patient was agreeable to walk-in on Monday, May 22, 2024.  Client states use of the following substances: None reported    Treatment recommendations are include plan patient to come in for walk-in medication management.  At [facility] on Monday, 05/22/2024.  Patient to opt out of therapeutic services at this time until she feels stabilized on her medications.     Client was in agreement with treatment recommendations.      I discussed the assessment and treatment plan with the patient. The patient was provided an opportunity to ask questions and all were answered. The patient agreed with the plan and demonstrated an understanding of the instructions.   The patient was advised to call back or seek an in-person evaluation if the symptoms worsen or if the condition fails to improve as anticipated.  I provided 55 minutes of non-face-to-face time during this encounter.   Anne GORMAN Patee, LCSW     CCA Screening, Triage and Referral (STR)  Patient Reported Information How did you hear about us ? Family/Friend  Referral name: [facility] at [facility]   Whom do you see for routine medical problems? I don't have a doctor  What Is the Reason for Your Visit/Call Today? Wanting to renegage in mental health services due to recent thoughts of SI that resulted in her going to observation unti at [facility] through [facility]  How Long Has This Been Causing You Problems? 1 wk - 1 month  What Do You Feel Would Help You the Most Today? Treatment for Depression or other mood problem; Stress Management; Medication(s)   Have You  Recently Been in Any Inpatient Treatment ([facility]/Detox/Crisis Center/28-Day Program)? Yes  Name/Location of Program/[facility]  How Long Were You There? 1 days  When Were You Discharged? 05/17/24   Have You Ever Received Services From [facility] Before? Yes  Who Do You See at [facility]? multiple services   Have You Recently Had Any Thoughts About Hurting Yourself? Yes  Are You Planning to Commit Suicide/Harm Yourself At This time? No   Have you Recently Had Thoughts About Hurting Someone Sherral? No  Explanation: n/a   Have You Used Any Alcohol or Drugs in the Past 24 Hours? No  Do You Currently Have a Therapist/Psychiatrist? No  Have You Been Recently Discharged From Any Office Practice or Programs? No   CCA Screening Triage Referral Assessment Type of Contact: Face-to-Face  Collateral Involvement: none  If Minor and Not Living with Parent(s), Who has Custody? n/a  Is CPS involved or ever been involved? Never  Is APS involved or ever been involved? Never  Patient Determined To Be At Risk for Harm To Self or Others Based on Review of Patient Reported Information or Presenting Complaint? Yes, for Self-Harm  Method: No Plan  Availability of Means: No access or NA  Intent: Vague intent or NA  Notification Required: No need or identified person  Additional Information for Danger to Others Potential: -- (n/a)  Additional Comments for Danger to Others Potential: n/a  Are There Guns or Other Weapons in Your Home? No  Types of Guns/Weapons: n/a  Are These Weapons Safely Secured?  No  Who Could Verify You Are Able To Have These Secured: n/a  Do You Have any Outstanding Charges, Pending Court Dates, Parole/Probation? none reported  Contacted To Inform of Risk of Harm To Self or Others: Family/Significant Other:  Location of Assessment: [facility] Services  Does Patient Present under Involuntary Commitment? No  [place]  of Residence: [place]  Patient Currently Receiving the Following Services: Not Receiving Services  Determination of Need: Urgent (48 hours)  Options For Referral: Outpatient Therapy; Medication Management   CCA Biopsychosocial Intake/Chief Complaint:  Depression and anxiety  Current Symptoms/Problems: recent SI that she was observed over night for, tension,  worry, overwhelmed, rapid thoughts, restlessness, hoplessness feelings  Patient Reported Schizophrenia/Schizoaffective Diagnosis in Past: No  Strengths: self-awareness  Preferences: therapy and medication mgnt  Abilities: none reported  Type of Services Patient Feels are Needed: therapy and medicaion mgnt  Initial Clinical Notes/Concerns: Recent SI with no intent but had a plan  Mental Health Symptoms Depression:  Hopelessness; Fatigue; Increase/decrease in appetite; Difficulty Concentrating; Irritability; Tearfulness; Sleep (too much or little); Worthlessness; Change in energy/activity   Duration of Depressive symptoms: Greater than two weeks   Mania:  None   Anxiety:   Worrying; Tension; Irritability; Restlessness   Psychosis:  None   Duration of Psychotic symptoms: No data recorded  Trauma:  None   Obsessions:  None   Compulsions:  None   Inattention:  None   Hyperactivity/Impulsivity:  None   Oppositional/Defiant Behaviors:  None   Emotional Irregularity:  Chronic feelings of emptiness; Potentially harmful impulsivity   Other Mood/Personality Symptoms:  n/a    Mental Status Exam Appearance and self-care  Stature:  Average   Weight:  Average weight   Clothing:  Age-appropriate   Grooming:  Normal   Cosmetic use:  None   Posture/gait:  Normal   Motor activity:  Not Remarkable   Sensorium  Attention:  Normal   Concentration:  Normal   Orientation:  X5   Recall/memory:  Normal   Affect and Mood  Affect:  Appropriate   Mood:  Depressed   Relating  Eye contact:  Normal    Facial expression:  Depressed   Attitude toward examiner:  Cooperative   Thought and Language  Speech flow: Normal   Thought content:  Appropriate to Mood and Circumstances   Preoccupation:  None   Hallucinations:  None   Organization:  No data recorded  [employer] of Knowledge:  Average   Intelligence:  Average   Abstraction:  Normal   Judgement:  Normal   Reality Testing:  Adequate   Insight:  Fair   Decision Making:  Impulsive   Social Functioning  Social Maturity:  Impulsive   Social Judgement:  Naive   Stress  Stressors:  Work; Family conflict   Coping Ability:  Overwhelmed; Exhausted   Skill Deficits:  Self-control   Supports:  Family; Friends/Service system     Religion: Religion/Spirituality Are You A Religious Person?: No How Might This Affect Treatment?: n/a  Leisure/Recreation: Leisure / Recreation Do You Have Hobbies?: Yes Leisure and Hobbies: playing piano and playing/walking dog  Exercise/Diet: Exercise/Diet Do You Exercise?: No Have You Gained or Lost A Significant Amount of Weight in the Past Six Months?: No Do You Follow a Special Diet?: No Do You Have Any Trouble Sleeping?: Yes Explanation of Sleeping Difficulties: rapid thoughts trouble falling asleep   CCA Employment/Education Employment/Work Situation: Employment / Work Situation Employment Situation: Employed Are You Satisfied With  Your Job?: Yes Do You Work More Than One Job?: No Work Stressors: Chief Operating Officer, high stress Patient's Job has Been Impacted by Current Illness: Yes Describe how Patient's Job has Been Impacted: high stress Has Patient ever Been in the [employer]?: No  Education: Education Is Patient Currently Attending School?: No Last Grade Completed: 18 Did Garment/textile technologist From [employer]?: Yes Did Theme park manager?: Yes What Type of College Degree Do you Have?: 3x Associates Degrees Did You Attend Graduate School?: No Did You Have  An Individualized Education Program (IIEP): No Did You Have Any Difficulty At School?: No Patient's Education Has Been Impacted by Current Illness: No   CCA Family/Childhood History Family and Relationship History: Family history Marital status: Single Are you sexually active?: Yes What is your sexual orientation?: hetrosexual Has your sexual activity been affected by drugs, alcohol, medication, or emotional stress?: none reported Does patient have children?: No  Childhood History:  Childhood History By whom was/is the patient raised?: Both parents Description of patient's relationship with caregiver when they were a child: Mom and dad Good Patient's description of current relationship with people who raised him/her: dad: Deceased mom: Has MS and pt is her caregiver Does patient have siblings?: Yes Number of Siblings: 2 Description of patient's current relationship with siblings: half sister distant relationship. Full sister: Average but can be strained Did patient suffer any verbal/emotional/physical/sexual abuse as a child?: No Did patient suffer from severe childhood neglect?: No Has patient ever been sexually abused/assaulted/raped as an adolescent or adult?: No Was the patient ever a victim of a crime or a disaster?: No Witnessed domestic violence?: No Has patient been affected by domestic violence as an adult?: No  Child/Adolescent Assessment:     CCA Substance Use Alcohol/Drug Use:   DSM5 Diagnoses: Patient Active Problem List   Diagnosis Date Noted   GAD (generalized anxiety disorder) 05/19/2024   Major depressive disorder, single episode, moderate (HCC) 05/19/2024     Collaboration of Care: Other Referral to medication mgnt and therapy services at [facility]    Patient/Guardian was advised Release of Information must be obtained prior to any record release in order to collaborate their care with an outside provider. Patient/Guardian was advised if they  have not already done so to contact the registration department to sign all necessary forms in order for us  to release information regarding their care.   Consent: Patient/Guardian gives verbal consent for treatment and assignment of benefits for services provided during this visit. Patient/Guardian expressed understanding and agreed to proceed.   Willene Holian S Ardyth Kelso, LCSW

## 2024-05-22 ENCOUNTER — Ambulatory Visit (INDEPENDENT_AMBULATORY_CARE_PROVIDER_SITE_OTHER): Admitting: Psychiatry

## 2024-05-22 ENCOUNTER — Encounter (HOSPITAL_COMMUNITY): Payer: Self-pay | Admitting: Psychiatry

## 2024-05-22 VITALS — BP 128/49 | HR 52 | Temp 98.0°F | Ht 67.0 in | Wt 165.8 lb

## 2024-05-22 DIAGNOSIS — R41840 Attention and concentration deficit: Secondary | ICD-10-CM

## 2024-05-22 DIAGNOSIS — F411 Generalized anxiety disorder: Secondary | ICD-10-CM | POA: Diagnosis not present

## 2024-05-22 DIAGNOSIS — F331 Major depressive disorder, recurrent, moderate: Secondary | ICD-10-CM

## 2024-05-22 MED ORDER — BUPROPION HCL ER (XL) 150 MG PO TB24: 150.0000 mg | ORAL_TABLET | ORAL | 3 refills | Status: DC

## 2024-05-22 MED ORDER — HYDROXYZINE HCL 10 MG PO TABS: 10.0000 mg | ORAL_TABLET | Freq: Three times a day (TID) | ORAL | 3 refills | Status: DC | PRN

## 2024-05-22 NOTE — Progress Notes (Signed)
 Psychiatric Initial Adult Assessment   Patient Identification: Anne Cantrell MRN:  [id number] Date of Evaluation:  05/22/2024 Referral Source: Walk-in Chief Complaint:  I have been depressed since my dad died and I would be open to medications Visit Diagnosis:    ICD-10-CM   1. Moderate episode of recurrent major depressive disorder (HCC)  F33.1 buPROPion (WELLBUTRIN XL) 150 MG 24 hr tablet    Ambulatory referral to Social Work    2. Poor concentration  R41.840 buPROPion (WELLBUTRIN XL) 150 MG 24 hr tablet    3. GAD (generalized anxiety disorder)  F41.1 hydrOXYzine (ATARAX) 10 MG tablet    Ambulatory referral to Social Work      History of Present Illness: 33 year old female seen today for initial psychiatric evaluation.  She walked into the clinic for medication management.  She has a psychiatric history of anxiety, depression, SI, and potential ADD.  Patient was seen at [facility] on 05/17/2024-05/18/2024 for increased depression and SI with plan to hang herself.  Currently she is not managed on medications.  She notes that she has trialed Strattera in the past when she received care from [facility] in Jun 14, 2020.  Today patient is well-groomed, pleasant, cooperative, and engaged in conversation.  Patient is hyperverbal throughout the exam and have some pressured speech.  She informed Clinical research associate that she has been depressed since the death of her father in 2019-06-15.  She reports that is overwhelmed with life.  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  At times she notes that she has poor sleep reporting sleeping 5 to 6 hours and other times noting that she sleeps all day.  Patient also reports that she has conflict with her siblings Murrel and Chester).  She notes that her sister Mallie does not help with their mother as much as she would like.  She describes feeling abandoned by Mallie. She also  reports that her half-sister Cecillia sides with Mallie.  On one occasion she notes that Mallie, her mother Annabelle, and Cecillia all confronted her at the same time and described her as being hostile.  She intern reports that she felt overwhelmed and told him that she thought about hanging herself the day before.  She notes that she does find support and her boyfriend Redell, her dog Fannie, and a friend.  Patient reports that her family does not approve of her and Brian's relationship as he struggled with addiction in the past.    Patient notes that she and her mother are moving into a smaller home that is wheelchair assessable.  She reports that the move has been stressful as she has not have the assistance from her sister.  She also notes that her sister called her job when she was hospitalized and she no longer works as a Investment banker, corporate for the [employer].  She reports that this is stressful.  Patient informed writer that on top of being anxious and depressed she feels that she has ADHD.  She notes that her past psychiatric provider did not hear her.  She informed Clinical research associate that she complained of headaches while being on Strattera and notes that nothing was done.  Patient describes being forgetful, inattentive to mentally taxing task, having poor listening skills, and disorganization.  Provider asked patient if she had records of psychological testing and she notes that she did not.  [facility].  Patient reports the above exacerbates her anxiety and depression.  Today provider conducted GAD-7 and patient scored a 14.  Provider also conducted PHQ-9 patient scored a 23.  Today she endorses passive SI but denies wanting to harm herself.  She denies SI/HI/AVH, mania, paranoia.  Patient does inform her that at time she has a ruminating thoughts that she forgot to lay her mother in bed.  Today patient agreeable to starting Wellbutrin XL 150 mg to help  manage depression and concentration.  She is also agreeable to starting hydroxyzine 10 mg 3 times daily to help manage anxiety and sleep.  [facility].  She was referred to outpatient counseling for therapy.  No other concerns at this time.  Associated Signs/Symptoms: Depression Symptoms:  depressed mood, anhedonia, insomnia, psychomotor agitation, fatigue, feelings of worthlessness/guilt, difficulty concentrating, impaired memory, suicidal thoughts without plan, anxiety, (Hypo) Manic Symptoms:  Distractibility, Irritable Mood, Anxiety Symptoms:  Excessive Worry, Psychotic Symptoms:  Denies PTSD Symptoms: Had a traumatic exposure:  Boyfriend struggled with addition in the past and her family does not approve  Past Psychiatric History: ADD, depression, anxiety  Previous Psychotropic Medications: Strattera,   Substance Abuse History in the last 12 months:  Yes.    Consequences of Substance Abuse: NA  Past Medical History:  Past Medical History:  Diagnosis Date   Asthma     Past Surgical History:  Procedure Laterality Date   BREAST SURGERY      Family Psychiatric History: Mother depression, anxiety, and ADHD, niece ADHD, sister mental health issues  Family History: No family history on file.  Social History:   Social History   Socioeconomic History   Marital status: Single    Spouse name: Not on file   Number of children: Not on file   Years of education: Not on file   Highest education level: Not on file  Occupational History   Not on file  Tobacco Use   Smoking status: Never   Smokeless tobacco: Never  Substance and Sexual Activity   Alcohol use: Yes    Alcohol/week: 8.0 standard drinks of alcohol    Types: 8 Standard drinks or equivalent per week   Drug use: Not on file    Comment: occ marijuana  from time to time   Sexual activity: Yes    Partners: Male    Birth control/protection: None   Other Topics Concern   Not on file  Social History Narrative   Not on file   Social Drivers of Health   Financial Resource Strain: Low Risk  (05/19/2024)   Overall Financial Resource Strain (CARDIA)    Difficulty of Paying Living Expenses: Not very hard  Food Insecurity: No Food Insecurity (05/19/2024)   Hunger Vital Sign    Worried About Running Out of Food in the Last Year: Never true    Ran Out of Food in the Last Year: Never true  Transportation Needs: No Transportation Needs (05/19/2024)   PRAPARE - Administrator, Civil Service (Medical): No    Lack of Transportation (Non-Medical): No  Physical Activity: Inactive (05/19/2024)   Exercise Vital Sign    Days of Exercise per Week: 0 days    Minutes of Exercise per Session: 0 min  Stress: Stress Concern Present (05/19/2024)   [employer] of Occupational Health - Occupational Stress Questionnaire    Feeling of Stress: Very much  Social Connections: Moderately Integrated (  05/19/2024)   Social Connection and Isolation Panel    Frequency of Communication with Friends and Family: Twice a week    Frequency of Social Gatherings with Friends and Family: Once a week    Attends Religious Services: 1 to 4 times per year    Active Member of [employer] or Organizations: Yes    Attends Engineer, structural: More than 4 times per year    Marital Status: Never married    Additional Social History: Patient resides in [place] with her mother. 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 She notes that she smokes marijuana occasionally. She also notes that she socially drinks alcohol. She denies tobacco use.   Allergies:   Allergies  Allergen Reactions   Nickel Itching and Swelling    Nickel earrings cause swelling and itchiness    Metabolic Disorder Labs: No results found for: HGBA1C, MPG No results found for: PROLACTIN No results found for: CHOL, TRIG, HDL, CHOLHDL, VLDL, LDLCALC Lab Results   Component Value Date   TSH 2.953 05/17/2024    Therapeutic Level Labs: No results found for: LITHIUM No results found for: CBMZ No results found for: VALPROATE  Current Medications: Current Outpatient Medications  Medication Sig Dispense Refill   buPROPion (WELLBUTRIN XL) 150 MG 24 hr tablet Take 1 tablet (150 mg total) by mouth every morning. 30 tablet 3   hydrOXYzine (ATARAX) 10 MG tablet Take 1 tablet (10 mg total) by mouth 3 (three) times daily as needed. 90 tablet 3   Cyanocobalamin (B-12) 1000 MCG CHEW Chew 1 tablet by mouth daily.     Multiple Vitamin (MULTIVITAMIN) tablet Take 1 tablet by mouth daily.     naproxen sodium (ALEVE) 220 MG tablet Take 220 mg by mouth every 8 (eight) hours as needed (For menstrual cramps). Tk 2 t PO at onset then 1t PO q8h     No current facility-administered medications for this visit.    Musculoskeletal: Strength & Muscle Tone: within normal limits Gait & Station: normal Patient leans: N/A  Psychiatric Specialty Exam: Review of Systems  Blood pressure (!) 128/49, pulse (!) 52, temperature 98 F (36.7 C), height 5' 7 (1.702 m), weight 165 lb 12.8 oz (75.2 kg), SpO2 100%.Body mass index is 25.97 kg/m.  General Appearance: Well Groomed  Eye Contact:  Good  Speech:  Clear and Coherent and Pressured  Volume:  Normal  Mood:  Anxious and Depressed  Affect:  Appropriate and Congruent  Thought Process:  Coherent, Goal Directed, and Linear  Orientation:  Full (Time, Place, and Person)  Thought Content:  WDL and Logical  Suicidal Thoughts:  No  Homicidal Thoughts:  No  Memory:  Immediate;   Good Recent;   Good Remote;   Good  Judgement:  Good  Insight:  Good  Psychomotor Activity:  Normal  Concentration:  Concentration: Fair and Attention Span: Fair  Recall:  Good  Fund of Knowledge:Good  Language: Good  Akathisia:  No  Handed:  Right  AIMS (if indicated):  not done  Assets:  Communication Skills Desire for  Improvement Financial Resources/Insurance Housing Intimacy Physical Health Social Support  ADL's:  Intact  Cognition: WNL  Sleep:  Fair   Screenings: AUDIT    Advertising copywriter from 05/19/2024 in [facility]  Alcohol Use Disorder Identification Test Final Score (AUDIT) 3   GAD-7    Flowsheet Row Office Visit from 05/22/2024 in [facility] Counselor from 05/19/2024 in [facility]  Total GAD-7 Score 14 17   PHQ2-9    Flowsheet Row Office Visit from 05/22/2024 in [facility] Counselor from 05/19/2024 in [facility] ED from 05/17/2024 in [facility]  PHQ-2 Total Score 5 6 2   PHQ-9 Total Score 23 21 6    Flowsheet Row Office Visit from 05/22/2024 in [facility] Counselor from 05/19/2024 in [facility] ED from 05/17/2024 in [facility]  C-SSRS RISK CATEGORY Error: Q7 should not be populated when Q6 is No Moderate Risk Moderate Risk    Assessment and Plan: Patient endorses increased anxiety, depression, and inattentiveness.  Today patient agreeable to starting Wellbutrin XL 150 mg to help manage depression and concentration.  She is also agreeable to starting hydroxyzine 10 mg 3 times daily to help manage anxiety and sleep.  [facility].  She was referred to outpatient counseling for therapy.  1. Poor concentration  Start- buPROPion (WELLBUTRIN XL) 150 MG 24 hr tablet; Take 1 tablet (150 mg total) by mouth every morning.  Dispense: 30 tablet; Refill: 3  2. GAD (generalized anxiety disorder)  Start- hydrOXYzine (ATARAX) 10 MG tablet; Take 1 tablet (10 mg total) by mouth 3 (three) times daily as needed.  Dispense: 90 tablet; Refill: 3 - Ambulatory referral to Social  Work  3. Moderate episode of recurrent major depressive disorder (HCC) (Primary)  Start- buPROPion (WELLBUTRIN XL) 150 MG 24 hr tablet; Take 1 tablet (150 mg total) by mouth every morning.  Dispense: 30 tablet; Refill: 3 - Ambulatory referral to Social Work   Collaboration of Care: Other provider involved in patient's care AEB PCP and counselor  Patient/Guardian was advised Release of Information must be obtained prior to any record release in order to collaborate their care with an outside provider. Patient/Guardian was advised if they have not already done so to contact the registration department to sign all necessary forms in order for us  to release information regarding their care.   Consent: Patient/Guardian gives verbal consent for treatment and assignment of benefits for services provided during this visit. Patient/Guardian expressed understanding and agreed to proceed.  Follow-up in 2 months Follow-up with therapy Zane FORBES Bach, NP 10/6/202510:32 AM

## 2024-07-17 NOTE — Progress Notes (Signed)
 This encounter was created in error - please disregard.

## 2024-07-24 ENCOUNTER — Encounter (HOSPITAL_COMMUNITY): Payer: Self-pay

## 2024-07-25 ENCOUNTER — Encounter (HOSPITAL_COMMUNITY): Payer: Self-pay

## 2024-07-25 ENCOUNTER — Encounter (HOSPITAL_COMMUNITY): Admitting: Psychiatry

## 2024-08-22 ENCOUNTER — Ambulatory Visit (INDEPENDENT_AMBULATORY_CARE_PROVIDER_SITE_OTHER): Admitting: Clinical

## 2024-08-22 DIAGNOSIS — F331 Major depressive disorder, recurrent, moderate: Secondary | ICD-10-CM

## 2024-08-22 NOTE — Progress Notes (Unsigned)
" ° °  THERAPIST PROGRESS NOTE  Session Time: 50 minutes  Participation Level: Active  Behavioral Response: CasualAlertEuthymic  Type of Therapy: Individual Therapy  Treatment Goals addressed: client will create at least 3 goal to work on in psychotherapy sessions  ProgressTowards Goals: Initial  Interventions: CBT and Supportive  Summary:  Anne Cantrell is a 34 y.o. female who presents for the scheduled appointment oriented times five, appropriately dressed and cooperative. Client denied hallucinations and delusions. Client is presenting for her first appointment to establish with outpatient therapy services.  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 Client reported she is full time living with her to prepare meals and what not. Client reported she moved in ,in 2020 when her dad passed to take over caring for her mom. 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 Client reported she has 3 associate degree's in fine arts ,music and firefighter. Client reported September 2025 she started having suicidal ideations with a plan to hang herself. Client reported the stress got to her. Client reported her friend helped her figure out she did not want to kill herself but just have relief from her current situation. Client reported her mother is open to in home help but she had a hard time with following through on the paperwork to get it done. Client reported she was supposed to get a evaluation from [facility] for [facility].   Client reported her support is minimal from her sisters. Client reported it is a lot also helping her mom through her hobbies because her mom gets annoyed how she is doing things. Client reported her sisters have not helped with caring for her mother. Client reported recently her mother tried to run away christmas eve because she was upset she did not go marathon oil. Client reported she did not take her mother out in the  crowds because her mother has a immunocompromised system. Client reported she calls her mother by her first name because their dynamic doesn't feel like mother and daughter.  Evidence of progress towards goal:  client reported medication compliance 7 days a week.   Suicidal/Homicidal: Nowithout intent/plan  Therapist Response:  Therapist began the appointment making introduction and discussing confidentiality.  Therapist engaged using active listening and positive emotional support. Therapist used cbt to engage ask open ended questions about her mental health history. Therapist used cbt to ask clarifying questions about effectiveness of medication management. Therapist used cbt to have her identify her desired goals for therapy. Therapist reinforced information about crisis services available to her. Therapist used CBT ask the client to identify her progress with frequency of use with coping skills with continued practice in her daily activity.      Plan: Return again in 3 weeks.  Diagnosis: moderate episode of recurrent major depressive disorder  Collaboration of Care: Patient refused AEB none requested by the client.  Patient/Guardian was advised Release of Information must be obtained prior to any record release in order to collaborate their care with an outside provider. Patient/Guardian was advised if they have not already done so to contact the registration department to sign all necessary forms in order for us  to release information regarding their care.   Consent: Patient/Guardian gives verbal consent for treatment and assignment of benefits for services provided during this visit. Patient/Guardian expressed understanding and agreed to proceed.   Keyvin Rison Y Enos Muhl, LCSW 08/22/2024  "

## 2024-08-24 NOTE — Progress Notes (Signed)
 [facility] Outpatient Progress Note  08/25/2024 10:27 AM Anne Cantrell  MRN:  [id number]  Assessment:  Patient presents for a follow-up appointment, previously seen by Laymon Bach for med management. In the prior appointment, Wellbutrin  and hydroxyzine  PRN was started and referral to therapy was completed. Today, patient is doing well regarding depression and anxiety. She does still notice issues with focusing at work, not completing her tasks and easily distracted. I discussed increasing Wellbutrin  as this is off-label used for ADHD as well. Patient has not had prior formal testing but she does have a historic dx of ADHD and she was placed on Strattera in the past. If wellbutrin  is ineffective in the future, we could do a DIVA assessment to assess for ADHD. Pt is not an acute safety risk at this time. F/u in 6 weeks.    Plan:  # Moderate episode of recurrent major depressive disorder  - Increase Wellbutrin  XL to 300 mg daily  # GAD - Continue Hydroxyzine  10 TID PRN  Patient was given contact information for behavioral health clinic and was instructed to call 911 for emergencies.   Identifying Information: Anne Cantrell is a 34 year old female who has a psychiatric history of anxiety, depression, seen at [facility] on 05/17/2024-05/18/2024 for increased depression and SI with plan to hang herself.    Subjective:  Interval History:   Patient seen alone.  Patient reports feeling good today. She notes being on Wellbutrin  since October and she feels doing smaller tasks was easier but not she feels its not working as effectively as before, reporting having difficulty with work.   Patient reports good sleep, attributing this to her hydroxyzine . Patient reports good appetite, reporting 2 meals daily with snacks.   Patient denies current SI, HI, and AVH. She states her wellbutrin  has helped with the SI in the past and she was also going through moving with her mother which was stressing her out.    Substance use:  Tobacco: denies Alcohol: 3-4 drinks a week Marijuana: most recently in October 2025   Past Psychiatric History:  Past Psychiatric History: ADD, depression, anxiety Previous Medications: Strattera (on it for 3 months but had difficulty with headaches) Psychiatrist: yes, multiple years ago; previously seen initially in this clinic by Zane Bach NP Therapist: seeing Paige  Hospitalizations: denies Suicide attempts: denies SIB: denies Access to weapons: denies  Hx of violence towards others: denies  Hx of trauma/abuse: emotional trauma  Substance use: Occasional marijuana  Past Medical History:  Past Medical History:  Diagnosis Date   Asthma     Past Surgical History:  Procedure Laterality Date   BREAST SURGERY      Family History: No family history on file.  Family Psychiatric History: Mother- depression, anxiety, and ADHD, niece ADHD, sister mental health issues  Social History:  Living: [place] with mother Education: has difficulty with school and failed out of multiple classes in the past Occupation: chief operating officer Relationship: has a boyfriend Children: denies Support: boyfriend, cousin, sister, friend Zed Legal History: denies  Allergies: Allergies[1]  Current Medications: Current Outpatient Medications  Medication Sig Dispense Refill   Cyanocobalamin (B-12) 1000 MCG CHEW Chew 1 tablet by mouth daily.     hydrOXYzine  (ATARAX ) 10 MG tablet Take 1 tablet (10 mg total) by mouth 3 (three) times daily as needed. 90 tablet 3   Multiple Vitamin (MULTIVITAMIN) tablet Take 1 tablet by mouth daily.     naproxen sodium (ALEVE) 220 MG tablet Take 220 mg by mouth every 8 (  eight) hours as needed (For menstrual cramps). Tk 2 t PO at onset then 1t PO q8h     No current facility-administered medications for this visit.    Objective:  Psychiatric Specialty Exam:  General Appearance: appears at stated age, casually dressed and groomed   Behavior:  pleasant and cooperative   Psychomotor Activity: no psychomotor agitation or retardation noted   Eye Contact: fair  Speech: normal amount, tone, volume and fluency    Mood: euthymic  Affect: congruent, pleasant and interactive   Thought Process: linear, goal directed, no circumstantial or tangential thought process noted, no racing thoughts or flight of ideas  Descriptions of Associations: intact   Thought Content Hallucinations: denies AH, VH , does not appear responding to stimuli  Delusions: no paranoia, delusions of control, grandeur, ideas of reference, thought broadcasting, and magical thinking  Suicidal Thoughts: denies SI, intention, plan  Homicidal Thoughts: denies HI, intention, plan   Alertness/Orientation: alert and fully oriented   Insight: fair Judgment: fair  Memory: intact   Executive Functions  Concentration: intact Attention Span: fair  Recall: intact  Fund of Knowledge: fair   Physical Exam  General: Pleasant, well-appearing. No acute distress. Pulmonary: Normal effort. No wheezing or rales. Skin: No obvious rash or lesions. Neuro: A&Ox3.No focal deficit.  Review of Systems  No reported symptoms  Metabolic Disorder Labs: No results found for: HGBA1C, MPG No results found for: PROLACTIN No results found for: CHOL, TRIG, HDL, CHOLHDL, VLDL, LDLCALC Lab Results  Component Value Date   TSH 2.953 05/17/2024    Therapeutic Level Labs: No results found for: LITHIUM No results found for: VALPROATE No results found for: CBMZ  Screenings:  AUDIT    Advertising Copywriter from 05/19/2024 in [facility]  Alcohol Use Disorder Identification Test Final Score (AUDIT) 3   GAD-7    Flowsheet Row Office Visit from 05/22/2024 in [facility] Counselor from 05/19/2024 in [facility]  Total GAD-7 Score 14 17   PHQ2-9    Flowsheet Row Office  Visit from 05/22/2024 in [facility] Counselor from 05/19/2024 in [facility] ED from 05/17/2024 in [facility]  PHQ-2 Total Score 5 6 2   PHQ-9 Total Score 23 21 6    Flowsheet Row Office Visit from 05/22/2024 in [facility] Counselor from 05/19/2024 in [facility] ED from 05/17/2024 in [facility]  C-SSRS RISK CATEGORY Error: Q7 should not be populated when Q6 is No Moderate Risk Moderate Risk    Collaboration of Care: Case discussed with attending, see attending's attestation for additional information.  Ismael Franco, MD PGY-3 Psychiatry Resident      [1]  Allergies Allergen Reactions   Nickel Itching and Swelling    Nickel earrings cause swelling and itchiness

## 2024-08-25 ENCOUNTER — Ambulatory Visit (INDEPENDENT_AMBULATORY_CARE_PROVIDER_SITE_OTHER): Admitting: Psychiatry

## 2024-08-25 VITALS — BP 122/60 | Wt 167.0 lb

## 2024-08-25 DIAGNOSIS — F321 Major depressive disorder, single episode, moderate: Secondary | ICD-10-CM | POA: Diagnosis not present

## 2024-08-25 DIAGNOSIS — F411 Generalized anxiety disorder: Secondary | ICD-10-CM | POA: Diagnosis not present

## 2024-08-25 MED ORDER — HYDROXYZINE HCL 10 MG PO TABS: 10.0000 mg | ORAL_TABLET | Freq: Three times a day (TID) | ORAL | 1 refills | Status: AC | PRN

## 2024-08-25 MED ORDER — BUPROPION HCL ER (XL) 300 MG PO TB24: 300.0000 mg | ORAL_TABLET | Freq: Every day | ORAL | 0 refills | Status: AC

## 2024-09-06 ENCOUNTER — Encounter (HOSPITAL_COMMUNITY): Payer: Self-pay

## 2024-09-11 ENCOUNTER — Ambulatory Visit (HOSPITAL_COMMUNITY): Admitting: Clinical

## 2024-09-11 ENCOUNTER — Encounter (HOSPITAL_COMMUNITY): Payer: Self-pay

## 2024-10-06 ENCOUNTER — Encounter (HOSPITAL_COMMUNITY): Admitting: Psychiatry
